# Patient Record
Sex: Female | Born: 1995
Health system: Southern US, Community
[De-identification: ages and names within clinical notes are randomized; demographics above are authoritative.]

## PROBLEM LIST (undated history)

## (undated) DIAGNOSIS — N946 Dysmenorrhea, unspecified: Secondary | ICD-10-CM

---
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
## 2015-05-18 ENCOUNTER — Ambulatory Visit: Payer: 59

## 2015-05-18 ENCOUNTER — Encounter: Payer: Self-pay | Admitting: Gynecology

## 2015-05-18 ENCOUNTER — Ambulatory Visit
Admission: EM | Admit: 2015-05-18 | Discharge: 2015-05-18 | Disposition: A | Discharge status: Home / Self Care | Payer: 59 | Attending: Family Medicine | Admitting: Family Medicine

## 2015-05-18 DIAGNOSIS — J40 Bronchitis, not specified as acute or chronic: Secondary | ICD-10-CM | POA: Diagnosis not present

## 2015-05-18 DIAGNOSIS — S2341XA Sprain of ribs, initial encounter: Secondary | ICD-10-CM

## 2015-05-18 DIAGNOSIS — J209 Acute bronchitis, unspecified: Secondary | ICD-10-CM | POA: Diagnosis not present

## 2015-05-18 DIAGNOSIS — S29011A Strain of muscle and tendon of front wall of thorax, initial encounter: Secondary | ICD-10-CM

## 2015-05-18 DIAGNOSIS — R05 Cough: Secondary | ICD-10-CM | POA: Diagnosis present

## 2015-05-18 HISTORY — DX: Dysmenorrhea, unspecified: N94.6

## 2015-05-18 MED ORDER — AZITHROMYCIN 250 MG PO TABS: ORAL_TABLET | ORAL | Status: DC

## 2015-05-18 MED ORDER — OXYCODONE-ACETAMINOPHEN 5-325 MG PO TABS: 1.0000 | ORAL_TABLET | Freq: Four times a day (QID) | ORAL | Status: DC | PRN

## 2015-05-18 NOTE — ED Provider Notes (Signed)
19 year old patient presents today with complaints of productive cough for the last 3 weeks. Patient states that she is started to have right anterior lateral rib pain with coughing, movement. She denies any night sweats or fever at home. She denies any other chest pain. She denies any significant past medical history asides dysmenorrhea. She denies any history of asthma or smoking. She does describe a pop that she felt on that side of her rib cage earlier today when coughing.  Review of systems negative except as mentioned above.  ROS: Negative except mentioned above.  GENERAL: NAD HEENT: no pharyngeal erythema, no exudate, no erythema of TMs, no cervical LAD RESP: CTA B, mild to moderate tenderness of anterior lateral ribcage on palpation, no stepoff appreciated  CARD: RRR NEURO: CN II-XII groslly intact   A/P: Bronchitis, intercostal strain X-ray negative for any acute process. Prescribed patient Zithromax and Percocet. Encourage patient to take deep breaths as much as possible. She is unable to take NSAIDs. She is to seek medical attention if symptoms do persist or worsen as discussed.  Jolene ProvostKirtida Gaia Gullikson, MD 05/18/15 [phone]

## 2015-05-18 NOTE — ED Notes (Signed)
Patient c/o x 3 weeks cold symptoms. Pt /co productive cough with greenish mucous. Pt. Also stated ride side muscle / rib pain dull / aching pain worse when couhing and taking a deep breath.

## 2016-03-03 DIAGNOSIS — J039 Acute tonsillitis, unspecified: Secondary | ICD-10-CM | POA: Diagnosis not present

## 2016-04-18 DIAGNOSIS — M84364A Stress fracture, left fibula, initial encounter for fracture: Secondary | ICD-10-CM | POA: Diagnosis not present

## 2016-05-19 ENCOUNTER — Ambulatory Visit
Admission: RE | Admit: 2016-05-19 | Discharge: 2016-05-19 | Disposition: A | Discharge status: Home / Self Care | Payer: 59 | Source: Ambulatory Visit | Attending: Pediatrics | Admitting: Pediatrics

## 2016-05-19 ENCOUNTER — Other Ambulatory Visit: Payer: Self-pay | Admitting: Pediatrics

## 2016-05-19 DIAGNOSIS — Z8781 Personal history of (healed) traumatic fracture: Secondary | ICD-10-CM

## 2016-05-19 DIAGNOSIS — S82402A Unspecified fracture of shaft of left fibula, initial encounter for closed fracture: Secondary | ICD-10-CM | POA: Diagnosis not present

## 2016-05-19 DIAGNOSIS — S82492A Other fracture of shaft of left fibula, initial encounter for closed fracture: Secondary | ICD-10-CM | POA: Diagnosis not present

## 2016-06-02 ENCOUNTER — Encounter: Payer: Self-pay | Admitting: Physical Therapy

## 2016-06-02 ENCOUNTER — Ambulatory Visit: Payer: 59 | Attending: Pediatrics | Admitting: Physical Therapy

## 2016-06-02 DIAGNOSIS — M25672 Stiffness of left ankle, not elsewhere classified: Secondary | ICD-10-CM | POA: Diagnosis not present

## 2016-06-02 DIAGNOSIS — M25572 Pain in left ankle and joints of left foot: Secondary | ICD-10-CM | POA: Insufficient documentation

## 2016-06-02 NOTE — Therapy (Signed)
[place] [facility] [address] [place], [place], [address] Phone: [phone]   Fax:  [phone]  Physical Therapy Evaluation  Patient Details  Name: Robin Hanna MRN: [id number] Date of Birth: 1996/09/04 Referring Provider: Baxter Hire Page  Encounter Date: 06/02/2016      PT End of Session - 06/02/16 1745    Visit Number 1   Number of Visits 1   Date for PT Re-Evaluation 06/03/16   PT Start Time 1344   PT Stop Time 1440   PT Time Calculation (min) 56 min   Activity Tolerance Patient tolerated treatment well;No increased pain   Behavior During Therapy [facility] for tasks assessed/performed      Past Medical History  Diagnosis Date  . Dysmenorrhea     History reviewed. No pertinent past surgical history.  There were no vitals filed for this visit.       Subjective Assessment - 06/02/16 1740    Subjective Pt. reports no ankle/ lower leg pain currently.  Pt. states she has been doing well since weaning off of boot.     Limitations Walking   Patient Stated Goals Increase L lower leg strength/ mobility.    Currently in Pain? No/denies            [facility] PT Assessment - 06/02/16 0001    Assessment   Medical Diagnosis L distal fibular fracture   Referring Provider Kristen Page   Onset Date/Surgical Date 04/21/16     See HEP (ankle/ LE stability ex.).  Recommended icing L ankle/lower leg if swelling noted.        PT Education - 06/02/16 1744    Education provided Yes   Education Details See HEP   Person(s) Educated Patient;Parent(s)   Methods Explanation;Demonstration;Handout   Comprehension Verbalized understanding;Returned demonstration             PT Long Term Goals - 06/02/16 1750    PT LONG TERM GOAL #1   Title Pt. I with HEP to increase L ankle stability (all planes) to improve pain-free mobility.    Time 1   Period Days   Status Achieved             Plan - 06/02/16 1745    Clinical Impression  Statement Pt. is a pleasant 20 y/o female s/p L distal fibular fracture.  Pt. is completely healed after last x-ray and has been out of boot over past week.  No c/o L ankle/lower leg pain at this time.  Pt. reports minimal stiffness and presents with no swelling in ankle (B ankle figure-8: 47.5 cm).  Ankle AROM WNL and strength grossly 5/5 MMT (all planes).  Good subtalar mobility.  Pt. ambulate with normalized gait pattern with no deficits noted.  Pt. instructed in LE stability ex. program to promote improved mobility.  Evaluation only with focus on independent HEP at this time.     Rehab Potential Excellent   PT Frequency 1x / week   PT Treatment/Interventions Gait training;Stair training;Functional mobility training;Therapeutic activities;Therapeutic exercise;Cryotherapy;Manual techniques   PT Next Visit Plan Evaluation only.  Pt. instructed to contact PT if any issues over next 1-2 weeks.    PT Home Exercise Plan See handouts.      Patient will benefit from skilled therapeutic intervention in order to improve the following deficits and impairments:  Decreased mobility, Decreased strength  Visit Diagnosis: Ankle joint stiffness, left  Pain in left ankle and joints of left foot  Problem List There are no active problems to display for this patient.  [name], PT, DPT # [phone]   06/02/2016, 5:52 PM  [place] [facility] [address] [place], [place], [address] Phone: [phone]   Fax:  [phone]  Name: Robin BordersRachel E Hanna MRN: [id number] Date of Birth: Feb 23, 1996

## 2016-08-06 DIAGNOSIS — H5213 Myopia, bilateral: Secondary | ICD-10-CM | POA: Diagnosis not present

## 2016-10-25 IMAGING — CR DG TIBIA/FIBULA 2V*L*
2 series · 2 of 2 positions shown · non-contrast
Comparison: None available

CLINICAL DATA: Hairline fracture of LEFT fibula 4.5 weeks ago

EXAM:
LEFT TIBIA AND FIBULA - 2 VIEW

[tibia ap]
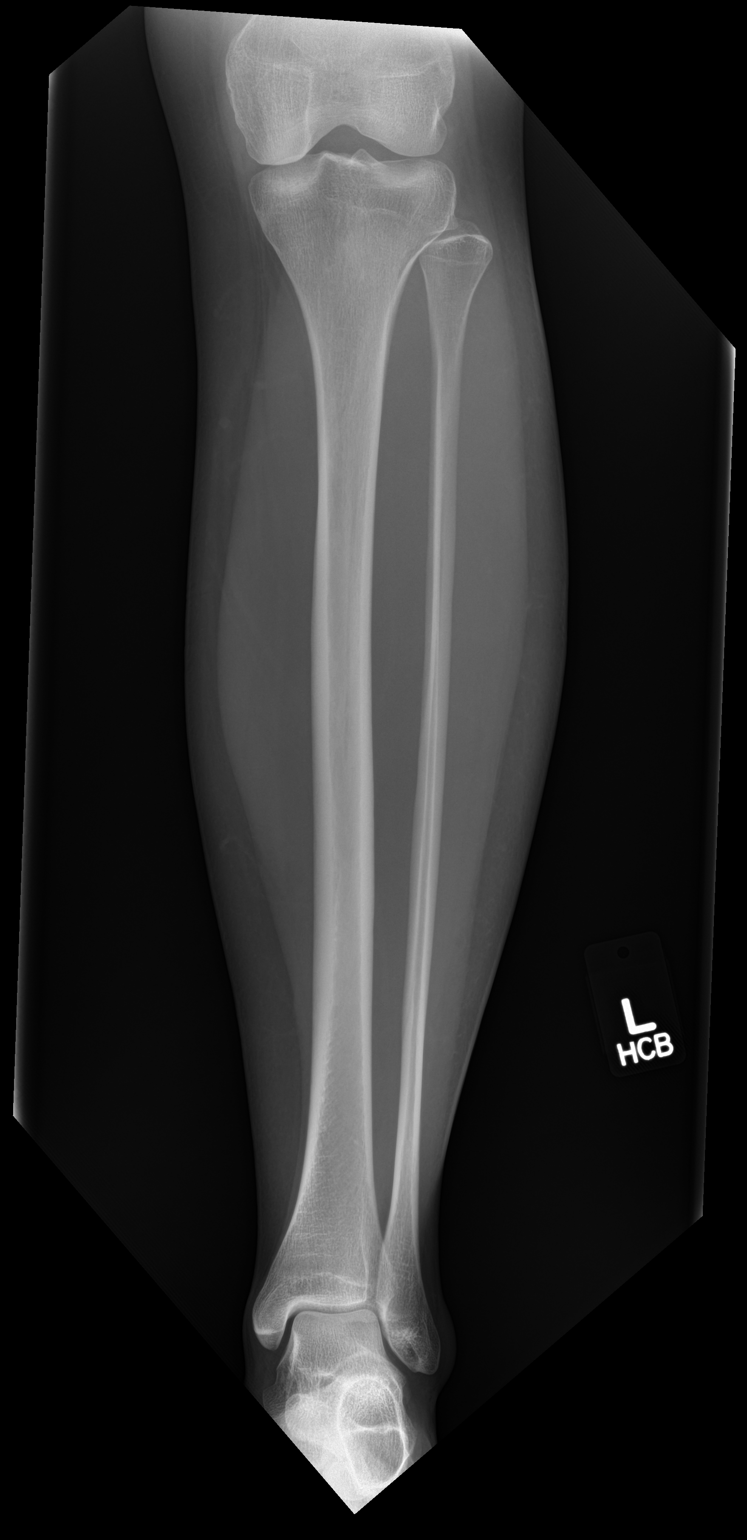

[tibia lat]
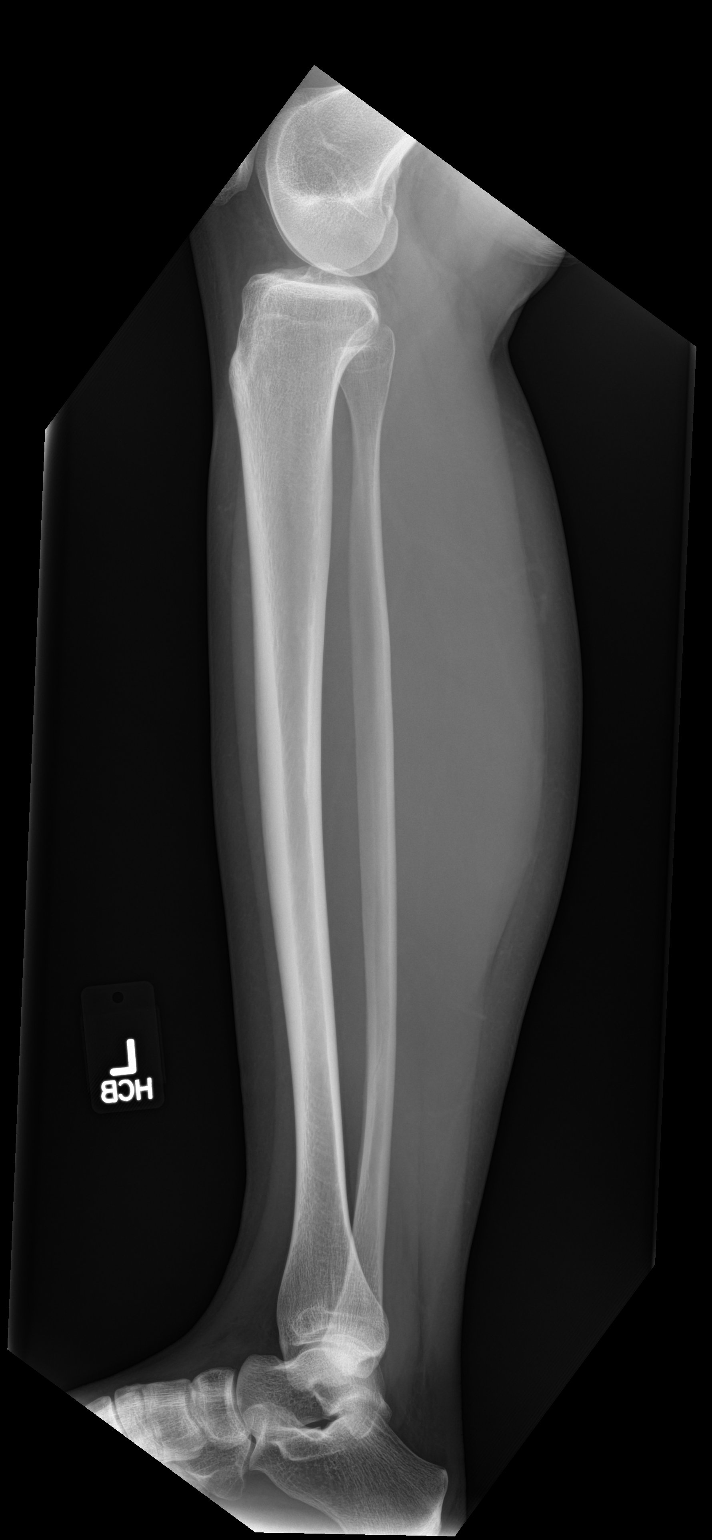

[2 of 2 positions shown; findings below may reference images not displayed]

FINDINGS: Knee and ankle joint alignments normal.

Osseous mineralization grossly normal.

No acute fracture, dislocation or bone destruction.

Specifically, no definite fibular fracture is identified.
IMPRESSION: Normal exam.

## 2016-12-18 DIAGNOSIS — L700 Acne vulgaris: Secondary | ICD-10-CM | POA: Diagnosis not present

## 2016-12-18 MED FILL — DOXYCYCLINE HYCLATE 100 MG: 100 | 30 days supply | Qty: 30 | Fill #0

## 2016-12-18 MED FILL — CLINDAMYCIN-BENZOYL PEROX 1: 1-5 | 30 days supply | Qty: 25 | Fill #0

## 2017-05-19 DIAGNOSIS — Z111 Encounter for screening for respiratory tuberculosis: Secondary | ICD-10-CM | POA: Diagnosis not present

## 2017-05-22 DIAGNOSIS — Z0000 Encounter for general adult medical examination without abnormal findings: Secondary | ICD-10-CM | POA: Diagnosis not present

## 2017-07-09 DIAGNOSIS — Z0182 Encounter for allergy testing: Secondary | ICD-10-CM | POA: Diagnosis not present

## 2017-07-09 DIAGNOSIS — L299 Pruritus, unspecified: Secondary | ICD-10-CM | POA: Diagnosis not present

## 2017-07-09 DIAGNOSIS — H1013 Acute atopic conjunctivitis, bilateral: Secondary | ICD-10-CM | POA: Diagnosis not present

## 2017-07-09 DIAGNOSIS — J3081 Allergic rhinitis due to animal (cat) (dog) hair and dander: Secondary | ICD-10-CM | POA: Diagnosis not present

## 2017-07-09 DIAGNOSIS — J301 Allergic rhinitis due to pollen: Secondary | ICD-10-CM | POA: Diagnosis not present

## 2017-08-17 DIAGNOSIS — H5213 Myopia, bilateral: Secondary | ICD-10-CM | POA: Diagnosis not present

## 2017-08-17 DIAGNOSIS — H47393 Other disorders of optic disc, bilateral: Secondary | ICD-10-CM | POA: Diagnosis not present

## 2017-08-27 MED FILL — CLINDAMYCIN-BENZOYL PEROX 1: 1-5 | 30 days supply | Qty: 25 | Fill #1

## 2018-12-02 DIAGNOSIS — H5213 Myopia, bilateral: Secondary | ICD-10-CM | POA: Diagnosis not present

## 2019-09-30 ENCOUNTER — Emergency Department
Admission: EM | Admit: 2019-09-30 | Discharge: 2019-09-30 | Disposition: A | Discharge status: Home / Self Care | Payer: 59 | Attending: Emergency Medicine | Admitting: Emergency Medicine

## 2019-09-30 ENCOUNTER — Emergency Department: Payer: 59

## 2019-09-30 ENCOUNTER — Encounter: Payer: Self-pay | Admitting: Emergency Medicine

## 2019-09-30 ENCOUNTER — Other Ambulatory Visit: Payer: Self-pay

## 2019-09-30 DIAGNOSIS — R1084 Generalized abdominal pain: Secondary | ICD-10-CM | POA: Diagnosis not present

## 2019-09-30 DIAGNOSIS — I10 Essential (primary) hypertension: Secondary | ICD-10-CM | POA: Diagnosis not present

## 2019-09-30 DIAGNOSIS — R52 Pain, unspecified: Secondary | ICD-10-CM | POA: Diagnosis not present

## 2019-09-30 DIAGNOSIS — R102 Pelvic and perineal pain: Secondary | ICD-10-CM | POA: Diagnosis not present

## 2019-09-30 DIAGNOSIS — R195 Other fecal abnormalities: Secondary | ICD-10-CM | POA: Insufficient documentation

## 2019-09-30 DIAGNOSIS — R0902 Hypoxemia: Secondary | ICD-10-CM | POA: Diagnosis not present

## 2019-09-30 DIAGNOSIS — R55 Syncope and collapse: Secondary | ICD-10-CM | POA: Diagnosis not present

## 2019-09-30 DIAGNOSIS — R1030 Lower abdominal pain, unspecified: Secondary | ICD-10-CM | POA: Diagnosis not present

## 2019-09-30 LAB — BASIC METABOLIC PANEL
Anion gap: 8 (ref 5–15)
BUN: 11 mg/dL (ref 6–20)
CO2: 24 mmol/L (ref 22–32)
Calcium: 9.3 mg/dL (ref 8.9–10.3)
Chloride: 107 mmol/L (ref 98–111)
Creatinine, Ser: 0.62 mg/dL (ref 0.44–1.00)
GFR calc Af Amer: >60 mL/min (ref >60)
GFR calc non Af Amer: >60 mL/min (ref >60)
Glucose, Bld: 95 mg/dL (ref 70–99)
Potassium: 3.6 mmol/L (ref 3.5–5.1)
Sodium: 139 mmol/L (ref 135–145)

## 2019-09-30 LAB — CBC
HCT: 38.7 % (ref 36.0–46.0)
Hemoglobin: 13 g/dL (ref 12.0–15.0)
MCH: 30.3 pg (ref 26.0–34.0)
MCHC: 33.6 g/dL (ref 30.0–36.0)
MCV: 90.2 fL (ref 80.0–100.0)
Platelets: 241 10*3/uL (ref 150–400)
RBC: 4.29 MIL/uL (ref 3.87–5.11)
RDW: 12.7 % (ref 11.5–15.5)
WBC: 9.9 10*3/uL (ref 4.0–10.5)
nRBC: 0 % (ref 0.0–0.2)

## 2019-09-30 LAB — URINALYSIS, COMPLETE (UACMP) WITH MICROSCOPIC
Bacteria, UA: NONE SEEN
Bilirubin Urine: NEGATIVE
Glucose, UA: NEGATIVE mg/dL
Ketones, ur: 5 mg/dL — AB
Leukocytes,Ua: NEGATIVE
Nitrite: NEGATIVE
Protein, ur: NEGATIVE mg/dL
Specific Gravity, Urine: 1.017 (ref 1.005–1.030)
pH: 6 (ref 5.0–8.0)

## 2019-09-30 LAB — PREGNANCY, URINE: Preg Test, Ur: NEGATIVE

## 2019-09-30 NOTE — ED Notes (Addendum)
Called down to lab to run an add on urine POC.

## 2019-09-30 NOTE — ED Provider Notes (Signed)
[facility] Emergency Department Provider Note  ____________________________________________  Time seen: Approximately 4:09 PM  I have reviewed the triage vital signs and the nursing notes.   HISTORY  Chief Complaint Dysmenorrhea and Near Syncope    HPI Robin Hanna is a 23 y.o. female who presents the emergency department concern for sharp lower pelvic pain.  Patient reports that today she started her menstrual cycle, typically has cramping for several days prior which is been normal.  Patient states that she went to the bathroom, and as she was leaving the bathroom she developed excruciatingly sharp lower pelvic pain.  Patient reports that the pain was so severe that she started feeling nauseated, did have one episode of emesis and then felt near syncopal.  According to the patient, her mother reports that she was not acting quite her normal self during this.  Patient reports that the pain relieved and is currently a 1 out of 10.  During its occurrence, patient describes the pain as 10 out of 10.  Patient denies any recent fevers or chills, nasal congestion, chest pain, palpitations, shortness of breath, any previous abdominal pain, nausea or vomiting, diarrhea or constipation.  Patient reports that she did have more bowel movements than normal over the last 2 days.  She states that she had 3 bowel movements yesterday, 1 today.  Patient denies any rectal bleeding.  Patient is on her menses but no vaginal discharge.  No concern for pregnancy or STD.  Patient reports that every 2 to 3 years she experiences a similar feeling during her period however it is never been this severe or caused near syncope.         Past Medical History:  Diagnosis Date  . Dysmenorrhea     There are no active problems to display for this patient.   History reviewed. No pertinent surgical history.  Prior to Admission medications   Medication Sig Start Date End Date Taking?  Authorizing Provider  azithromycin (ZITHROMAX Z-PAK) 250 MG tablet Use as directed on box. Patient not taking: Reported on 06/02/2016 05/18/15   Jolene Provost, MD  Norgestimate-Eth Estradiol (SPRINTEC 28 PO) Take by mouth.    [provider]  oxyCODONE-acetaminophen (PERCOCET/ROXICET) 5-325 MG per tablet Take 1 tablet by mouth every 6 (six) hours as needed for moderate pain or severe pain. Patient not taking: Reported on 06/02/2016 05/18/15   Jolene Provost, MD    Allergies Ibuprofen  No family history on file.  Social History Social History   Tobacco Use  . Smoking status: Never Smoker  . Smokeless tobacco: Never Used  Substance Use Topics  . Alcohol use: No  . Drug use: Not Currently     Review of Systems  Constitutional: No fever/chills Eyes: No visual changes. No discharge ENT: No upper respiratory complaints. Cardiovascular: no chest pain. Respiratory: no cough. No SOB. Gastrointestinal: No abdominal pain.  No nausea, no vomiting.  No diarrhea.  No constipation. Genitourinary: Pelvic pain.  Negative for dysuria. No hematuria.  No abnormal vaginal bleeding or discharge other than menstrual bleeding. Musculoskeletal: Negative for musculoskeletal pain. Skin: Negative for rash, abrasions, lacerations, ecchymosis. Neurological: Negative for headaches, focal weakness or numbness. 10-point ROS otherwise negative.  ____________________________________________   PHYSICAL EXAM:  VITAL SIGNS: ED Triage Vitals  Enc Vitals Group     BP 09/30/19 1312 117/62     Pulse Rate 09/30/19 1312 (!) 115     Resp 09/30/19 1312 16     Temp 09/30/19 1312  98.4 F (36.9 C)     Temp Source 09/30/19 1312 Oral     SpO2 09/30/19 1312 100 %     Weight 09/30/19 1317 115 lb (52.2 kg)     Height 09/30/19 1317 5\' 6"  (1.676 m)     Head Circumference --      Peak Flow --      Pain Score 09/30/19 1317 1     Pain Loc --      Pain Edu? --      Excl. in GC? --      Constitutional: Alert  and oriented. Well appearing and in no acute distress. Eyes: Conjunctivae are normal. PERRL. EOMI. Head: Atraumatic. ENT:      Ears:       Nose: No congestion/rhinnorhea.      Mouth/Throat: Mucous membranes are moist.  Neck: No stridor.    Cardiovascular: Normal rate, regular rhythm. Normal S1 and S2.  Good peripheral circulation. Respiratory: Normal respiratory effort without tachypnea or retractions. Lungs CTAB. Good air entry to the bases with no decreased or absent breath sounds. Gastrointestinal: No visible abnormal external abdominal wall findings.  Bowel sounds 4 quadrants. Soft and nontender to palpation all 4 quadrants.  No tenderness to palpation of the suprapubic region.. No guarding or rigidity. No palpable masses. No distention. No CVA tenderness. Genitourinary: Pelvic exam not performed at this time. Musculoskeletal: Full range of motion to all extremities. No gross deformities appreciated. Neurologic:  Normal speech and language. No gross focal neurologic deficits are appreciated.  Skin:  Skin is warm, dry and intact. No rash noted. Psychiatric: Mood and affect are normal. Speech and behavior are normal. Patient exhibits appropriate insight and judgement.   ____________________________________________   LABS (all labs ordered are listed, but only abnormal results are displayed)  Labs Reviewed  URINALYSIS, COMPLETE (UACMP) WITH MICROSCOPIC - Abnormal; Notable for the following components:      Result Value   Color, Urine YELLOW (*)    APPearance HAZY (*)    Hgb urine dipstick SMALL (*)    Ketones, ur 5 (*)    All other components within normal limits  BASIC METABOLIC PANEL  CBC  PREGNANCY, URINE  CBG MONITORING, ED   ____________________________________________  EKG   ____________________________________________  RADIOLOGY I personally viewed and evaluated these images as part of my medical decision making, as well as reviewing the written report by the  radiologist.  Dg Abdomen 1 View  Result Date: 09/30/2019 CLINICAL DATA:  Lower abdominal pain EXAM: ABDOMEN - 1 VIEW COMPARISON:  None. FINDINGS: No dilated small bowel loops. Mild colorectal stool volume. No evidence of pneumatosis or pneumoperitoneum. No radiopaque nephrolithiasis. Clear visualized lung bases. IMPRESSION: Nonobstructive bowel gas pattern. Electronically Signed   By: 11/30/2019 M.D.   On: 09/30/2019 18:21   11/30/2019 Pelvis (transabdominal Only)  Result Date: 09/30/2019 CLINICAL DATA:  Severe pelvic pain EXAM: TRANSABDOMINAL ULTRASOUND OF PELVIS DOPPLER ULTRASOUND OF OVARIES TECHNIQUE: Transabdominal ultrasound examination of the pelvis was performed. Transabdominal technique was performed for global imaging of the pelvis including uterus, ovaries, adnexal regions, and pelvic cul-de-sac. Color and duplex Doppler ultrasound was utilized to evaluate blood flow to the ovaries. COMPARISON:  None. FINDINGS: Uterus Measurements: 7.1 x 3.3 x 4.4 cm = volume: 55 mL. No fibroids or other mass visualized. Endometrium Thickness: 9 mm.  No focal abnormality visualized. Right ovary Measurements: 3.7 x 1.8 x 2.0 cm = volume: 7 mL. Subcentimeter follicles. Normal appearance/no adnexal mass. Left ovary Measurements: 3.0  x 2.4 x 2.4 cm = volume: 9 mL. Subcentimeter follicles. Normal appearance/no adnexal mass. Pulsed Doppler evaluation of both ovaries demonstrates normal low-resistance arterial and venous waveforms. Other findings No abnormal free fluid. IMPRESSION: No ultrasound abnormality of the pelvis to explain pain. Electronically Signed   By: Eddie Candle M.D.   On: 09/30/2019 18:32   US Pelvic Doppler Limited  Result Date: 09/30/2019 CLINICAL DATA:  Severe pelvic pain EXAM: TRANSABDOMINAL ULTRASOUND OF PELVIS DOPPLER ULTRASOUND OF OVARIES TECHNIQUE: Transabdominal ultrasound examination of the pelvis was performed. Transabdominal technique was performed for global imaging of the pelvis including  uterus, ovaries, adnexal regions, and pelvic cul-de-sac. Color and duplex Doppler ultrasound was utilized to evaluate blood flow to the ovaries. COMPARISON:  None. FINDINGS: Uterus Measurements: 7.1 x 3.3 x 4.4 cm = volume: 55 mL. No fibroids or other mass visualized. Endometrium Thickness: 9 mm.  No focal abnormality visualized. Right ovary Measurements: 3.7 x 1.8 x 2.0 cm = volume: 7 mL. Subcentimeter follicles. Normal appearance/no adnexal mass. Left ovary Measurements: 3.0 x 2.4 x 2.4 cm = volume: 9 mL. Subcentimeter follicles. Normal appearance/no adnexal mass. Pulsed Doppler evaluation of both ovaries demonstrates normal low-resistance arterial and venous waveforms. Other findings No abnormal free fluid. IMPRESSION: No ultrasound abnormality of the pelvis to explain pain. Electronically Signed   By: Eddie Candle M.D.   On: 09/30/2019 18:32    ____________________________________________    PROCEDURES  Procedure(s) performed:    Procedures    Medications - No data to display   ____________________________________________   INITIAL IMPRESSION / ASSESSMENT AND PLAN / ED COURSE  Pertinent labs & imaging results that were available during my care of the patient were reviewed by me and considered in my medical decision making (see chart for details).  Review of the [place] CSRS was performed in accordance of the [place] prior to dispensing any controlled drugs.           Patient's diagnosis is consistent with pelvic pain, near-syncope, increased stool volume.  Patient presented to the emergency department complaining of sharp pelvic pain that lasted for approximately 20 minutes and then resolved.  During this time, patient began to feel lightheaded, had a near syncopal episode with nausea and emesis.  Patient reports that pain is a 1 at this time.  She did start her menses today.  She has had intermittent pain with her periods.  Patient was on birth control at one point for metorrhagia.  At  this time, labs, urinalysis, ultrasound are reassuring.  Patient did have moderate stool in the distal colon and rectal region.  I suspect that symptoms are likely attributed to both pelvic pain from cramping as well as mild constipation.  I encouraged the patient to drink plenty of fluids, eat fiber, use MiraLAX as needed.  Tylenol Motrin with additional fluids and heat as needed for pelvic pain.  At this time no indication for further work-up.  Patient will follow-up with OB/GYN.. Patient is given ED precautions to return to the ED for any worsening or new symptoms.     ____________________________________________  FINAL CLINICAL IMPRESSION(S) / ED DIAGNOSES  Final diagnoses:  Pelvic pain  Near syncope  Increased stool volume      NEW MEDICATIONS STARTED DURING THIS VISIT:  ED Discharge Orders    None          This chart was dictated using voice recognition software/Dragon. Despite best efforts to proofread, errors can occur which can change the meaning. Any change  was purely unintentional.    Racheal PatchesCuthriell, Jonathan D, PA-C 09/30/19 1909    Emily FilbertWilliams, Jonathan E, MD 09/30/19 2008

## 2019-09-30 NOTE — ED Notes (Signed)
Called lab and spoke with [facility] and he will add on urine preg to urine in lab

## 2019-09-30 NOTE — ED Notes (Signed)
First Nurse Note: Pt to ED via EMS for abdominal pain and near syncopal episode. Pt initially c/o 10/10 abdominal pain, now states that pain is 1/10.

## 2019-09-30 NOTE — ED Triage Notes (Signed)
Pt to ED via [facility] from home for menstrual cramps that patient rated 10/10 and near syncopal episode. Pt mother told EMS pt got very pale and was not responding appropriately. By the time pt got to the ED she stated that her pain was better. Pt is currently rating pain 1/10. Pt is tachycardic in triage, otherwise NAD.

## 2019-10-24 DIAGNOSIS — Z0000 Encounter for general adult medical examination without abnormal findings: Secondary | ICD-10-CM | POA: Diagnosis not present

## 2019-10-24 DIAGNOSIS — N946 Dysmenorrhea, unspecified: Secondary | ICD-10-CM | POA: Diagnosis not present

## 2019-10-24 DIAGNOSIS — Z13220 Encounter for screening for lipoid disorders: Secondary | ICD-10-CM | POA: Diagnosis not present

## 2019-10-25 MED FILL — LIDOCAINE HCL 2% JELLY: 2 | 7 days supply | Qty: 30 | Fill #0

## 2019-10-25 MED FILL — FEMYNOR 0.25-35 MG-MCG TABS: 0.25-35 | 84 days supply | Qty: 84 | Fill #0

## 2020-01-26 MED FILL — FEMYNOR 0.25-35 MG-MCG TABS: 0.25-35 | 84 days supply | Qty: 84 | Fill #1

## 2020-02-20 DIAGNOSIS — R002 Palpitations: Secondary | ICD-10-CM | POA: Insufficient documentation

## 2020-02-24 DIAGNOSIS — G8929 Other chronic pain: Secondary | ICD-10-CM | POA: Insufficient documentation

## 2020-02-24 DIAGNOSIS — R002 Palpitations: Secondary | ICD-10-CM | POA: Diagnosis not present

## 2020-02-24 DIAGNOSIS — R9431 Abnormal electrocardiogram [ECG] [EKG]: Secondary | ICD-10-CM | POA: Insufficient documentation

## 2020-03-23 DIAGNOSIS — R9431 Abnormal electrocardiogram [ECG] [EKG]: Secondary | ICD-10-CM | POA: Diagnosis not present

## 2020-03-23 DIAGNOSIS — R002 Palpitations: Secondary | ICD-10-CM | POA: Diagnosis not present

## 2020-04-25 MED FILL — FEMYNOR 0.25-35 MG-MCG TABS: 0.25-35 | 84 days supply | Qty: 84 | Fill #2

## 2020-05-11 DIAGNOSIS — R140 Abdominal distension (gaseous): Secondary | ICD-10-CM | POA: Diagnosis not present

## 2020-05-11 DIAGNOSIS — R1084 Generalized abdominal pain: Secondary | ICD-10-CM | POA: Diagnosis not present

## 2020-05-18 DIAGNOSIS — K5900 Constipation, unspecified: Secondary | ICD-10-CM | POA: Diagnosis not present

## 2020-05-18 DIAGNOSIS — R140 Abdominal distension (gaseous): Secondary | ICD-10-CM | POA: Diagnosis not present

## 2020-05-18 DIAGNOSIS — R1084 Generalized abdominal pain: Secondary | ICD-10-CM | POA: Diagnosis not present

## 2020-05-18 DIAGNOSIS — R195 Other fecal abnormalities: Secondary | ICD-10-CM | POA: Diagnosis not present

## 2020-06-11 DIAGNOSIS — R140 Abdominal distension (gaseous): Secondary | ICD-10-CM | POA: Diagnosis not present

## 2020-07-19 MED FILL — FEMYNOR 0.25-35 MG-MCG TABS: 0.25-35 | 84 days supply | Qty: 84 | Fill #3

## 2020-11-13 DIAGNOSIS — N942 Vaginismus: Secondary | ICD-10-CM | POA: Diagnosis not present

## 2020-11-13 DIAGNOSIS — Z0000 Encounter for general adult medical examination without abnormal findings: Secondary | ICD-10-CM | POA: Diagnosis not present

## 2021-01-16 DIAGNOSIS — M9903 Segmental and somatic dysfunction of lumbar region: Secondary | ICD-10-CM | POA: Diagnosis not present

## 2021-01-16 DIAGNOSIS — M9901 Segmental and somatic dysfunction of cervical region: Secondary | ICD-10-CM | POA: Diagnosis not present

## 2021-01-16 DIAGNOSIS — M9902 Segmental and somatic dysfunction of thoracic region: Secondary | ICD-10-CM | POA: Diagnosis not present

## 2021-02-04 DIAGNOSIS — M9902 Segmental and somatic dysfunction of thoracic region: Secondary | ICD-10-CM | POA: Diagnosis not present

## 2021-02-04 DIAGNOSIS — M9901 Segmental and somatic dysfunction of cervical region: Secondary | ICD-10-CM | POA: Diagnosis not present

## 2021-02-04 DIAGNOSIS — M9903 Segmental and somatic dysfunction of lumbar region: Secondary | ICD-10-CM | POA: Diagnosis not present

## 2021-08-21 ENCOUNTER — Other Ambulatory Visit: Payer: Self-pay

## 2021-08-21 ENCOUNTER — Encounter: Payer: Self-pay | Admitting: Emergency Medicine

## 2021-08-21 ENCOUNTER — Ambulatory Visit
Admission: EM | Admit: 2021-08-21 | Discharge: 2021-08-21 | Disposition: A | Discharge status: Home / Self Care | Payer: BC Managed Care – PPO | Attending: Family Medicine | Admitting: Family Medicine

## 2021-08-21 DIAGNOSIS — J028 Acute pharyngitis due to other specified organisms: Secondary | ICD-10-CM | POA: Insufficient documentation

## 2021-08-21 DIAGNOSIS — Z20822 Contact with and (suspected) exposure to covid-19: Secondary | ICD-10-CM | POA: Insufficient documentation

## 2021-08-21 DIAGNOSIS — J029 Acute pharyngitis, unspecified: Secondary | ICD-10-CM

## 2021-08-21 DIAGNOSIS — Z886 Allergy status to analgesic agent status: Secondary | ICD-10-CM | POA: Insufficient documentation

## 2021-08-21 DIAGNOSIS — B9789 Other viral agents as the cause of diseases classified elsewhere: Secondary | ICD-10-CM | POA: Insufficient documentation

## 2021-08-21 LAB — POCT RAPID STREP A: Streptococcus, Group A Screen (Direct): NEGATIVE

## 2021-08-21 NOTE — ED Provider Notes (Signed)
[facility]    CSN: [id number] Arrival date & time: 08/21/21  1738      History   Chief Complaint Chief Complaint  Patient presents with   Sore Throat   Fever    HPI  25 year old female presents with the above complaints.  Sore throat started this morning.  Patient reports a low-grade fever of 99.  She has no true documented fever.  She states that she has pain with swallowing.  She has taken a home COVID test and it was negative.  She has no other respiratory symptoms.  Her pain is 4/10 in severity.  No other complaints.  Past Medical History:  Diagnosis Date   Dysmenorrhea    Home Medications    Prior to Admission medications   Medication Sig Start Date End Date Taking? Authorizing Provider  Norgestimate-Eth Estradiol (SPRINTEC 28 PO) Take by mouth.    [provider]   Social History Social History   Tobacco Use   Smoking status: Never   Smokeless tobacco: Never  Substance Use Topics   Alcohol use: No   Drug use: Not Currently     Allergies   Ibuprofen   Review of Systems Review of Systems  HENT:  Positive for sore throat.   Respiratory: Negative.     Physical Exam Triage Vital Signs ED Triage Vitals  Enc Vitals Group     BP 08/21/21 1801 (!) 114/55     Pulse Rate 08/21/21 1801 80     Resp 08/21/21 1801 16     Temp 08/21/21 1801 99.2 F (37.3 C)     Temp Source 08/21/21 1801 Oral     SpO2 08/21/21 1801 100 %     Weight 08/21/21 1759 115 lb (52.2 kg)     Height --      Head Circumference --      Peak Flow --      Pain Score 08/21/21 1758 4     Pain Loc --      Pain Edu? --      Excl. in GC? --    Updated Vital Signs BP (!) 114/55 (BP Location: Left Arm)   Pulse 80   Temp 99.2 F (37.3 C) (Oral)   Resp 16   Wt 52.2 kg   LMP 08/19/2021   SpO2 100%   BMI 18.56 kg/m   Visual Acuity Right Eye Distance:   Left Eye Distance:   Bilateral Distance:    Right Eye Near:   Left Eye Near:    Bilateral Near:      Physical Exam Vitals and nursing note reviewed.  Constitutional:      General: She is not in acute distress.    Appearance: Normal appearance. She is not ill-appearing.  HENT:     Head: Normocephalic and atraumatic.     Right Ear: Tympanic membrane normal.     Left Ear: Tympanic membrane normal.     Mouth/Throat:     Pharynx: Posterior oropharyngeal erythema present. No oropharyngeal exudate.  Cardiovascular:     Rate and Rhythm: Normal rate and regular rhythm.  Pulmonary:     Effort: Pulmonary effort is normal.     Breath sounds: Normal breath sounds. No wheezing, rhonchi or rales.  Neurological:     Mental Status: She is alert.   UC Treatments / Results  Labs (all labs ordered are listed, but only abnormal results are displayed) Labs Reviewed  SARS CORONAVIRUS 2 (TAT 6-24 HRS)  CULTURE, GROUP A  STREP [facility])  POCT RAPID STREP A, ED / UC  POCT RAPID STREP A    EKG   Radiology No results found.  Procedures Procedures (including critical care time)  Medications Ordered in UC Medications - No data to display  Initial Impression / Assessment and Plan / UC Course  I have reviewed the triage vital signs and the nursing notes.  Pertinent labs & imaging results that were available during my care of the patient were reviewed by me and considered in my medical decision making (see chart for details).    25 year old female presents with sore throat.  Mild erythema on exam.  Strep negative.  Awaiting Test results.  Advised supportive care.  Tylenol as needed.  Warm salt water gargles.  Final Clinical Impressions(s) / UC Diagnoses   Final diagnoses:  Viral pharyngitis     Discharge Instructions      Strep negative.  Warm salt water gargles.  Take care  Dr. Adriana Simas      ED Prescriptions   None    PDMP not reviewed this encounter.   Tommie Sams, [place] 08/21/21 1839

## 2021-08-21 NOTE — ED Triage Notes (Signed)
Pt presents today with c/o of sore throat and low grade fever that began this morning. She took home covid test today; neg.

## 2021-08-21 NOTE — Discharge Instructions (Addendum)
Strep negative.  Warm salt water gargles.  Take care  Dr. Adriana Simas

## 2021-08-22 LAB — SARS CORONAVIRUS 2 (TAT 6-24 HRS): SARS Coronavirus 2: NEGATIVE

## 2021-08-24 LAB — CULTURE, GROUP A STREP (THRC)

## 2021-08-28 ENCOUNTER — Other Ambulatory Visit: Payer: Self-pay

## 2021-08-28 ENCOUNTER — Ambulatory Visit
Admission: EM | Admit: 2021-08-28 | Discharge: 2021-08-28 | Disposition: A | Discharge status: Home / Self Care | Payer: BC Managed Care – PPO | Attending: Sports Medicine | Admitting: Sports Medicine

## 2021-08-28 DIAGNOSIS — U071 COVID-19: Secondary | ICD-10-CM | POA: Insufficient documentation

## 2021-08-28 DIAGNOSIS — J028 Acute pharyngitis due to other specified organisms: Secondary | ICD-10-CM | POA: Insufficient documentation

## 2021-08-28 DIAGNOSIS — R0981 Nasal congestion: Secondary | ICD-10-CM | POA: Diagnosis present

## 2021-08-28 DIAGNOSIS — J069 Acute upper respiratory infection, unspecified: Secondary | ICD-10-CM | POA: Diagnosis present

## 2021-08-28 DIAGNOSIS — R059 Cough, unspecified: Secondary | ICD-10-CM | POA: Diagnosis present

## 2021-08-28 DIAGNOSIS — B9789 Other viral agents as the cause of diseases classified elsewhere: Secondary | ICD-10-CM | POA: Diagnosis present

## 2021-08-28 MED ORDER — FLUTICASONE PROPIONATE 50 MCG/ACT NA SUSP: 2.0000 | Freq: Every day | NASAL | 0 refills | Status: DC

## 2021-08-28 MED ORDER — PROMETHAZINE-DM 6.25-15 MG/5ML PO SYRP: 5.0000 mL | ORAL_SOLUTION | Freq: Four times a day (QID) | ORAL | 0 refills | Status: DC | PRN

## 2021-08-28 NOTE — ED Provider Notes (Signed)
[facility]    CSN: [id number] Arrival date & time: 08/28/21  0856      History   Chief Complaint Chief Complaint  Patient presents with   Cough    HPI Robin Hanna is a 25 y.o. female.   25 year old female presents for evaluation of persistent URI symptoms.  She was seen here 6 days ago and diagnosed with viral pharyngitis.  Sore throat is improving.  Strep test was negative.  She is now complaining of increased cough with mild mucus production.  She is also complaining of nasal congestion, and stuffy nose.  Has been taking Tylenol, Zicam, and Benadryl.  She reports that she has a low-grade fever to 99.  She is currently afebrile.  Vital signs are stable.  Normally goes to [facility] in [place].  She works in [employer] in [employer].  She says she does not need a work note.  No chest pain or shortness of breath.  She has been exposed to COVID.  She did do a home COVID test 4 days ago that was positive.  She states that her symptoms are the same as her parents.  No abdominal or urinary symptoms.  She is requesting a COVID test.  No red flag signs or symptoms elicited on history.   Past Medical History:  Diagnosis Date   Dysmenorrhea     There are no problems to display for this patient.   History reviewed. No pertinent surgical history.  OB History   No obstetric history on file.      Home Medications    Prior to Admission medications   Medication Sig Start Date End Date Taking? Authorizing Provider  fluticasone (FLONASE) 50 MCG/ACT nasal spray Place 2 sprays into both nostrils daily. 08/28/21  Yes Delton See, MD  promethazine-dextromethorphan (PROMETHAZINE-DM) 6.25-15 MG/5ML syrup Take 5 mLs by mouth 4 (four) times daily as needed for cough. 08/28/21  Yes Delton See, MD  Norgestimate-Eth Estradiol (SPRINTEC 28 PO) Take by mouth.    [provider]    Family History History reviewed. No pertinent family history.  Social  History Social History   Tobacco Use   Smoking status: Never   Smokeless tobacco: Never  Substance Use Topics   Alcohol use: No   Drug use: Not Currently     Allergies   Ibuprofen and Nsaids   Review of Systems Review of Systems  Constitutional:  Positive for fever. Negative for activity change, appetite change, chills, diaphoresis and fatigue.  HENT:  Positive for congestion. Negative for ear pain, postnasal drip, rhinorrhea, sinus pressure, sinus pain, sneezing and sore throat.   Eyes:  Negative for pain.  Respiratory:  Positive for cough. Negative for chest tightness, shortness of breath and wheezing.   Cardiovascular:  Negative for chest pain and palpitations.  Gastrointestinal:  Negative for abdominal pain, diarrhea, nausea and vomiting.  Genitourinary:  Negative for dysuria.  Musculoskeletal:  Negative for back pain, myalgias and neck pain.  Skin:  Negative for color change, pallor, rash and wound.  Neurological:  Negative for dizziness, light-headedness and headaches.  All other systems reviewed and are negative.   Physical Exam Triage Vital Signs ED Triage Vitals  Enc Vitals Group     BP 08/28/21 0908 123/70     Pulse Rate 08/28/21 0908 81     Resp 08/28/21 0908 18     Temp 08/28/21 0908 98.7 F (37.1 C)     Temp Source 08/28/21 0908 Oral  SpO2 08/28/21 0908 100 %     Weight 08/28/21 0906 115 lb (52.2 kg)     Height 08/28/21 0906 5\' 6"  (1.676 m)     Head Circumference --      Peak Flow --      Pain Score 08/28/21 0906 0     Pain Loc --      Pain Edu? --      Excl. in GC? --    No data found.  Updated Vital Signs BP 123/70 (BP Location: Left Arm)   Pulse 81   Temp 98.7 F (37.1 C) (Oral)   Resp 18   Ht 5\' 6"  (1.676 m)   Wt 52.2 kg   LMP 08/19/2021   SpO2 100%   BMI 18.56 kg/m   Visual Acuity Right Eye Distance:   Left Eye Distance:   Bilateral Distance:    Right Eye Near:   Left Eye Near:    Bilateral Near:     Physical Exam Vitals  and nursing note reviewed.  Constitutional:      General: She is not in acute distress.    Appearance: Normal appearance. She is not ill-appearing, toxic-appearing or diaphoretic.  HENT:     Head: Normocephalic and atraumatic.     Right Ear: Tympanic membrane normal.     Left Ear: Tympanic membrane normal.     Nose: Congestion present. No rhinorrhea.     Mouth/Throat:     Mouth: Mucous membranes are moist.     Pharynx: Posterior oropharyngeal erythema present. No oropharyngeal exudate.  Eyes:     General:        Right eye: No discharge.        Left eye: No discharge.     Extraocular Movements: Extraocular movements intact.     Conjunctiva/sclera: Conjunctivae normal.     Pupils: Pupils are equal, round, and reactive to light.  Cardiovascular:     Rate and Rhythm: Normal rate and regular rhythm.     Pulses: Normal pulses.     Heart sounds: Normal heart sounds. No murmur heard.   No friction rub. No gallop.  Pulmonary:     Effort: Pulmonary effort is normal.     Breath sounds: Normal breath sounds. No stridor. No wheezing, rhonchi or rales.  Musculoskeletal:     Cervical back: Normal range of motion and neck supple.  Skin:    General: Skin is warm and dry.     Capillary Refill: Capillary refill takes less than 2 seconds.  Neurological:     General: No focal deficit present.     Mental Status: She is alert and oriented to person, place, and time.     UC Treatments / Results  Labs (all labs ordered are listed, but only abnormal results are displayed) Labs Reviewed  SARS CORONAVIRUS 2 (TAT 6-24 HRS)    EKG   Radiology No results found.  Procedures Procedures (including critical care time)  Medications Ordered in UC Medications - No data to display  Initial Impression / Assessment and Plan / UC Course  I have reviewed the triage vital signs and the nursing notes.  Pertinent labs & imaging results that were available during my care of the patient were reviewed by  me and considered in my medical decision making (see chart for details).  Clinical impression: 1.  Viral upper respiratory tract infection 2.  Cough 3.  Nasal congestion 4.  Viral sore throat  Treatment plan: 1.  The findings and treatment  plan were discussed in detail with the patient.  Patient was in agreement. 2.  We will add in medication for her cough and nasal congestion.  Sent them to the pharmacy. 3.  Educational handouts provided. 4.  We will go ahead and retest her for COVID.  It was sent to the hospital.  Advised her to follow along with the app on her phone called MyChart.  She should go ahead and isolate until she has her test results.  She says she does not need a work note. 5.  Supportive care, over-the-counter meds as needed.  Tylenol or Motrin for any fever or discomfort.  Plenty of rest and plenty fluids. 6.  If symptoms persist she should see her PCP. 7.  If symptoms worsen she should go to the ER. 8.  She was discharged in stable condition will follow-up here as needed.    Final Clinical Impressions(s) / UC Diagnoses   Final diagnoses:  Viral upper respiratory tract infection  Cough  Nasal congestion  Sore throat (viral)     Discharge Instructions      As we discussed, you have a viral URI. I did prescribe 2 medications for your symptoms.  Use as directed, but as needed. Plenty of rest, plenty fluids, Tylenol or ibuprofen for any fever or discomfort. Please see educational handouts. If your symptoms persist and you can see your primary care provider. If your symptoms worsen then you should go to the ER or come back here.     ED Prescriptions     Medication Sig Dispense Auth. Provider   promethazine-dextromethorphan (PROMETHAZINE-DM) 6.25-15 MG/5ML syrup Take 5 mLs by mouth 4 (four) times daily as needed for cough. 180 mL Delton See, MD   fluticasone [facility]) 50 MCG/ACT nasal spray Place 2 sprays into both nostrils daily. 15.8 mL Delton See,  MD      PDMP not reviewed this encounter.   Delton See, MD 08/28/21 1013

## 2021-08-28 NOTE — Discharge Instructions (Addendum)
As we discussed, you have a viral URI. I did prescribe 2 medications for your symptoms.  Use as directed, but as needed. Plenty of rest, plenty fluids, Tylenol or ibuprofen for any fever or discomfort. Please see educational handouts. If your symptoms persist and you can see your primary care provider. If your symptoms worsen then you should go to the ER or come back here.
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
## 2021-08-29 LAB — SARS CORONAVIRUS 2 (TAT 6-24 HRS): SARS Coronavirus 2: POSITIVE — AB

## 2024-07-25 ENCOUNTER — Encounter (HOSPITAL_COMMUNITY): Payer: Self-pay | Admitting: Emergency Medicine

## 2024-07-25 ENCOUNTER — Ambulatory Visit (HOSPITAL_COMMUNITY): Admission: EM | Admit: 2024-07-25 | Discharge: 2024-07-25 | Disposition: A | Discharge status: Home / Self Care

## 2024-07-25 ENCOUNTER — Ambulatory Visit (INDEPENDENT_AMBULATORY_CARE_PROVIDER_SITE_OTHER)

## 2024-07-25 DIAGNOSIS — S161XXA Strain of muscle, fascia and tendon at neck level, initial encounter: Secondary | ICD-10-CM

## 2024-07-25 MED ORDER — BACLOFEN 10 MG PO TABS: 10.0000 mg | ORAL_TABLET | Freq: Three times a day (TID) | ORAL | 0 refills | Status: DC

## 2024-07-25 MED ORDER — PREDNISONE 20 MG PO TABS: 40.0000 mg | ORAL_TABLET | Freq: Every day | ORAL | 0 refills | Status: AC
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
## 2024-10-06 ENCOUNTER — Encounter (HOSPITAL_COMMUNITY): Payer: Self-pay

## 2024-10-06 ENCOUNTER — Ambulatory Visit (HOSPITAL_COMMUNITY)
Admission: EM | Admit: 2024-10-06 | Discharge: 2024-10-06 | Disposition: A | Discharge status: Home / Self Care | Attending: Student | Admitting: Student

## 2024-10-06 DIAGNOSIS — R399 Unspecified symptoms and signs involving the genitourinary system: Secondary | ICD-10-CM | POA: Diagnosis not present

## 2024-10-06 LAB — POCT URINALYSIS DIP (MANUAL ENTRY)
Bilirubin, UA: NEGATIVE
Glucose, UA: NEGATIVE mg/dL
Ketones, POC UA: NEGATIVE mg/dL
Nitrite, UA: NEGATIVE
Protein Ur, POC: NEGATIVE mg/dL
Spec Grav, UA: <=1.005 — AB (ref 1.010–1.025)
Urobilinogen, UA: 0.2 U/dL
pH, UA: 6 (ref 5.0–8.0)

## 2024-10-06 LAB — POCT URINE PREGNANCY: Preg Test, Ur: NEGATIVE

## 2024-10-06 MED ORDER — CEPHALEXIN 500 MG PO CAPS: 500.0000 mg | ORAL_CAPSULE | Freq: Two times a day (BID) | ORAL | 0 refills | Status: AC

## 2024-10-06 NOTE — Discharge Instructions (Signed)
-  Keflex twice daily x7 days -Drink plenty of fluids. Okay to continue cranberry pills -Follow-up if symptoms persist or worsen: abdominal pain, back pain, new fevers

## 2024-10-06 NOTE — ED Provider Notes (Signed)
 [facility]    CSN: [id number] Arrival date & time: 10/06/24  9166      History   Chief Complaint Chief Complaint  Patient presents with   Abdominal Pain    HPI Robin Hanna is a 28 y.o. female presenting with urinary symptoms for about 10 days. As a child had recurrent UTI and reflux bladder, but denies issues with this as an adult.  Describes suprapubic pressure and stabbing pains after urination, which terminates on its own after 2-3 minutes, worse after morning void. Also some pain with moving the abdominal muscles. Rates discomfort 3/10. Nausea without vomiting this morning. Taking cranberry pills and good hydration with some improvements. LMP was 2 weeks ago and was normal. Denies vaginal symptoms.   HPI  Past Medical History:  Diagnosis Date   Dysmenorrhea     There are no active problems to display for this patient.   History reviewed. No pertinent surgical history.  OB History   No obstetric history on file.      Home Medications    Prior to Admission medications   Medication Sig Start Date End Date Taking? Authorizing Provider  cephALEXin (KEFLEX) 500 MG capsule Take 1 capsule (500 mg total) by mouth 2 (two) times daily for 7 days. 10/06/24 10/13/24 Yes Arlyss Leita FORBES, PA-C    Family History History reviewed. No pertinent family history.  Social History Social History   Tobacco Use   Smoking status: Never   Smokeless tobacco: Never  Vaping Use   Vaping status: Never Used  Substance Use Topics   Alcohol use: No   Drug use: Not Currently     Allergies   Ibuprofen and Nsaids   Review of Systems Review of Systems  Constitutional:  Negative for appetite change, chills, diaphoresis and fever.  Respiratory:  Negative for shortness of breath.   Cardiovascular:  Negative for chest pain.  Gastrointestinal:  Positive for abdominal pain. Negative for blood in stool, constipation, diarrhea, nausea and vomiting.  Genitourinary:   Negative for decreased urine volume, difficulty urinating, dysuria, flank pain, frequency, genital sores, hematuria, urgency, vaginal discharge and vaginal pain.  Musculoskeletal:  Negative for back pain.  Neurological:  Negative for dizziness, weakness and light-headedness.  All other systems reviewed and are negative.    Physical Exam Triage Vital Signs ED Triage Vitals  Encounter Vitals Group     BP 10/06/24 0928 (!) 116/57     Girls Systolic BP Percentile --      Girls Diastolic BP Percentile --      Boys Systolic BP Percentile --      Boys Diastolic BP Percentile --      Pulse Rate 10/06/24 0928 93     Resp 10/06/24 0928 16     Temp 10/06/24 0928 97.7 F (36.5 C)     Temp Source 10/06/24 0928 Oral     SpO2 10/06/24 0928 99 %     Weight 10/06/24 0928 130 lb (59 kg)     Height 10/06/24 0928 5' 6 (1.676 m)     Head Circumference --      Peak Flow --      Pain Score 10/06/24 0926 4     Pain Loc --      Pain Education --      Exclude from Growth Chart --    No data found.  Updated Vital Signs BP (!) 116/57 (BP Location: Right Arm)   Pulse 93   Temp 97.7 F (36.5  C) (Oral)   Resp 16   Ht 5' 6 (1.676 m)   Wt 130 lb (59 kg)   LMP 09/22/2024 (Approximate)   SpO2 99%   BMI 20.98 kg/m   Visual Acuity Right Eye Distance:   Left Eye Distance:   Bilateral Distance:    Right Eye Near:   Left Eye Near:    Bilateral Near:     Physical Exam Vitals reviewed.  Constitutional:      General: She is not in acute distress.    Appearance: Normal appearance. She is not ill-appearing.  HENT:     Head: Normocephalic and atraumatic.     Mouth/Throat:     Mouth: Mucous membranes are moist.     Comments: Moist mucous membranes Eyes:     Extraocular Movements: Extraocular movements intact.     Pupils: Pupils are equal, round, and reactive to light.  Cardiovascular:     Rate and Rhythm: Normal rate and regular rhythm.     Heart sounds: Normal heart sounds.  Pulmonary:      Effort: Pulmonary effort is normal.     Breath sounds: Normal breath sounds. No wheezing, rhonchi or rales.  Abdominal:     General: Bowel sounds are normal. There is no distension.     Palpations: Abdomen is soft. There is no mass.     Tenderness: There is abdominal tenderness in the suprapubic area. There is no right CVA tenderness, left CVA tenderness, guarding or rebound. Negative signs include Murphy's sign, Rovsing's sign and McBurney's sign.     Comments: Suprapubic region is mildly tender to palpation  Skin:    General: Skin is warm.     Capillary Refill: Capillary refill takes less than 2 seconds.     Comments: Good skin turgor  Neurological:     General: No focal deficit present.     Mental Status: She is alert and oriented to person, place, and time.  Psychiatric:        Mood and Affect: Mood normal.        Behavior: Behavior normal.      UC Treatments / Results  Labs (all labs ordered are listed, but only abnormal results are displayed) Labs Reviewed  POCT URINALYSIS DIP (MANUAL ENTRY) - Abnormal; Notable for the following components:      Result Value   Clarity, UA cloudy (*)    Spec Grav, UA <=1.005 (*)    Blood, UA trace-intact (*)    Leukocytes, UA Small (1+) (*)    All other components within normal limits  POCT URINE PREGNANCY - Normal  URINE CULTURE    EKG   Radiology No results found.  Procedures Procedures (including critical care time)  Medications Ordered in UC Medications - No data to display  Initial Impression / Assessment and Plan / UC Course  I have reviewed the triage vital signs and the nursing notes.  Pertinent labs & imaging results that were available during my care of the patient were reviewed by me and considered in my medical decision making (see chart for details).     Patient is a 28 year old female presenting with urinary symptoms for about 10 days.  Primary symptom is lower abdominal discomfort following voids.  On UA,  small leuk, trace blood, negative nitrite.  Negative urine pregnancy test.  The patient denies vaginal symptoms.  We will manage for UTI with Keflex pending urine culture results.  We also assisted with finding her a PCP today.  Return precautions:  Worsening symptoms, including abdominal pain, new back pain, new fevers, new vaginal symptoms.  Final Clinical Impressions(s) / UC Diagnoses   Final diagnoses:  Urinary symptom or sign     Discharge Instructions      -Keflex twice daily x7 days -Drink plenty of fluids. Okay to continue cranberry pills -Follow-up if symptoms persist or worsen: abdominal pain, back pain, new fevers      ED Prescriptions     Medication Sig Dispense Auth. Provider   cephALEXin (KEFLEX) 500 MG capsule Take 1 capsule (500 mg total) by mouth 2 (two) times daily for 7 days. 14 capsule Sabastien Tyler E, PA-C      PDMP not reviewed this encounter.   Arlyss Leita BRAVO, PA-C 10/06/24 1039

## 2024-10-06 NOTE — ED Triage Notes (Signed)
 Onset 1 week ago with lower abdomen pressure after urination, sharp stabbing lower abdominal pain, abdominal pain with movement. Denies any painful urination.   Has been taking otc cranberry pills and increased fluid intake with slight relief.

## 2024-12-07 ENCOUNTER — Ambulatory Visit (INDEPENDENT_AMBULATORY_CARE_PROVIDER_SITE_OTHER): Payer: Self-pay | Admitting: Nurse Practitioner

## 2024-12-07 VITALS — BP 98/59 | HR 105 | Wt 133.4 lb

## 2024-12-07 DIAGNOSIS — Z1329 Encounter for screening for other suspected endocrine disorder: Secondary | ICD-10-CM | POA: Diagnosis not present

## 2024-12-07 DIAGNOSIS — K6289 Other specified diseases of anus and rectum: Secondary | ICD-10-CM | POA: Diagnosis not present

## 2024-12-07 DIAGNOSIS — R300 Dysuria: Secondary | ICD-10-CM | POA: Diagnosis not present

## 2024-12-07 DIAGNOSIS — Z13220 Encounter for screening for lipoid disorders: Secondary | ICD-10-CM

## 2024-12-07 DIAGNOSIS — Z0000 Encounter for general adult medical examination without abnormal findings: Secondary | ICD-10-CM

## 2024-12-07 DIAGNOSIS — N390 Urinary tract infection, site not specified: Secondary | ICD-10-CM | POA: Diagnosis not present

## 2024-12-07 DIAGNOSIS — K625 Hemorrhage of anus and rectum: Secondary | ICD-10-CM

## 2024-12-07 LAB — POCT URINALYSIS DIP (CLINITEK)
Bilirubin, UA: NEGATIVE
Glucose, UA: NEGATIVE mg/dL
Ketones, POC UA: NEGATIVE mg/dL
Nitrite, UA: NEGATIVE
POC PROTEIN,UA: NEGATIVE
Spec Grav, UA: 1.025 (ref 1.010–1.025)
Urobilinogen, UA: 0.2 U/dL
pH, UA: 6.5 (ref 5.0–8.0)

## 2024-12-07 MED ORDER — SULFAMETHOXAZOLE-TRIMETHOPRIM 800-160 MG PO TABS: 1.0000 | ORAL_TABLET | Freq: Two times a day (BID) | ORAL | 0 refills | Status: AC

## 2024-12-07 NOTE — Progress Notes (Signed)
 Subjective   Patient ID: Robin Hanna, female    DOB: Aug 19, 1996, 28 y.o.   MRN: [id number]  Chief Complaint  Patient presents with   Establish Care   Cystitis    Patient stated that she had bladder pain after urinating. Lower abdominal pain and pressure.     Referring provider: [place], [place] [facility]  Robin Hanna is a 28 y.o. female with Past Medical History: No date: Dysmenorrhea   HPI  Patient presents today to establish care.  She would like a physical with blood work today.  She does complain today of dysuria and lower suprapubic abdominal pain.  She has been taking cranberry supplements and drinking water.  She does also complain of occasional rectal pain with rectal bleeding.  She states that it feels like severe rectal cramps.  We will place a referral for her to GI for further evaluation. Denies f/c/s, n/v/d, hemoptysis, PND, leg swelling Denies chest pain or edema          Allergies  Allergen Reactions   Ibuprofen Swelling   Nsaids Swelling   Azithromycin  Palpitations     There is no immunization history on file for this patient.  Tobacco History: Social History   Tobacco Use  Smoking Status Never  Smokeless Tobacco Never   Counseling given: Not Answered   Outpatient Encounter Medications as of 12/07/2024  Medication Sig   sulfamethoxazole-trimethoprim (BACTRIM DS) 800-160 MG tablet Take 1 tablet by mouth 2 (two) times daily for 3 days.   No facility-administered encounter medications on file as of 12/07/2024.    Review of Systems  Review of Systems  Constitutional: Negative.   HENT: Negative.    Cardiovascular: Negative.   Gastrointestinal: Negative.   Allergic/Immunologic: Negative.   Neurological: Negative.   Psychiatric/Behavioral: Negative.       Objective:   BP (!) 98/59 (BP Location: Right Arm, Patient Position: Sitting, Cuff Size: Normal)   Pulse (!) 105   Wt 133 lb 6.4 oz (60.5 kg)   SpO2 98%   BMI 21.53 kg/m    Wt Readings from Last 5 Encounters:  12/07/24 133 lb 6.4 oz (60.5 kg)  10/06/24 130 lb (59 kg)  08/28/21 115 lb (52.2 kg)  08/21/21 115 lb (52.2 kg)  09/30/19 115 lb (52.2 kg)     Physical Exam Vitals and nursing note reviewed.  Constitutional:      General: She is not in acute distress.    Appearance: She is well-developed.  Cardiovascular:     Rate and Rhythm: Normal rate and regular rhythm.  Pulmonary:     Effort: Pulmonary effort is normal.     Breath sounds: Normal breath sounds.  Neurological:     Mental Status: She is alert and oriented to person, place, and time.       Assessment & Plan:   Dysuria -     POCT URINALYSIS DIP (CLINITEK)  Rectal pain -     Ambulatory referral to Gastroenterology  Rectal bleeding -     Ambulatory referral to Gastroenterology -     CBC -     Comprehensive metabolic panel with GFR  Lipid screening -     Lipid panel  Thyroid  disorder screen -     TSH  Routine adult health maintenance -     CBC -     Comprehensive metabolic panel with GFR -     Lipid panel -     TSH  Urinary tract infection without hematuria, site  unspecified -     Urine Culture  Other orders -     Sulfamethoxazole-Trimethoprim; Take 1 tablet by mouth 2 (two) times daily for 3 days.  Dispense: 6 tablet; Refill: 0     Return in about 4 weeks (around 01/04/2025) for uti follow up.     Bascom GORMAN Borer, NP 12/07/2024

## 2024-12-08 ENCOUNTER — Ambulatory Visit: Admitting: Nurse Practitioner

## 2024-12-08 ENCOUNTER — Ambulatory Visit: Payer: Self-pay | Admitting: Nurse Practitioner

## 2024-12-08 LAB — COMPREHENSIVE METABOLIC PANEL WITH GFR
ALT: 10 IU/L (ref 0–32)
AST: 15 IU/L (ref 0–40)
Albumin: 4.6 g/dL (ref 4.0–5.0)
Alkaline Phosphatase: 47 IU/L (ref 41–116)
BUN/Creatinine Ratio: 21 (ref 9–23)
BUN: 17 mg/dL (ref 6–20)
Bilirubin Total: 0.2 mg/dL (ref 0.0–1.2)
CO2: 19 mmol/L — ABNORMAL LOW (ref 20–29)
Calcium: 9.3 mg/dL (ref 8.7–10.2)
Chloride: 102 mmol/L (ref 96–106)
Creatinine, Ser: 0.8 mg/dL (ref 0.57–1.00)
Globulin, Total: 2.3 g/dL (ref 1.5–4.5)
Glucose: 82 mg/dL (ref 70–99)
Potassium: 4.5 mmol/L (ref 3.5–5.2)
Sodium: 136 mmol/L (ref 134–144)
Total Protein: 6.9 g/dL (ref 6.0–8.5)
eGFR: 103 mL/min/1.73 (ref >59)

## 2024-12-08 LAB — CBC
Hematocrit: 38.4 % (ref 34.0–46.6)
Hemoglobin: 12.6 g/dL (ref 11.1–15.9)
MCH: 30.4 pg (ref 26.6–33.0)
MCHC: 32.8 g/dL (ref 31.5–35.7)
MCV: 93 fL (ref 79–97)
Platelets: 265 x10E3/uL (ref 150–450)
RBC: 4.15 x10E6/uL (ref 3.77–5.28)
RDW: 12.8 % (ref 11.7–15.4)
WBC: 7.6 x10E3/uL (ref 3.4–10.8)

## 2024-12-08 LAB — LIPID PANEL
Chol/HDL Ratio: 2.5 ratio (ref 0.0–4.4)
Cholesterol, Total: 187 mg/dL (ref 100–199)
HDL: 76 mg/dL (ref >39)
LDL Chol Calc (NIH): 99 mg/dL (ref 0–99)
Triglycerides: 61 mg/dL (ref 0–149)
VLDL Cholesterol Cal: 12 mg/dL (ref 5–40)

## 2024-12-08 LAB — TSH: TSH: 2.16 u[IU]/mL (ref 0.450–4.500)

## 2024-12-09 LAB — URINE CULTURE: Organism ID, Bacteria: NO GROWTH

## 2025-01-05 ENCOUNTER — Encounter: Payer: Self-pay | Admitting: Nurse Practitioner

## 2025-01-05 ENCOUNTER — Ambulatory Visit: Payer: Self-pay | Admitting: Nurse Practitioner

## 2025-01-05 VITALS — BP 100/60 | HR 85 | Temp 98.1°F | Wt 144.0 lb

## 2025-01-05 DIAGNOSIS — R300 Dysuria: Secondary | ICD-10-CM

## 2025-01-05 LAB — POCT URINALYSIS DIP (CLINITEK)
Bilirubin, UA: NEGATIVE
Glucose, UA: NEGATIVE mg/dL
Ketones, POC UA: NEGATIVE mg/dL
Nitrite, UA: NEGATIVE
POC PROTEIN,UA: NEGATIVE
Spec Grav, UA: 1.02
Urobilinogen, UA: 0.2 U/dL
pH, UA: 7

## 2025-01-05 NOTE — Progress Notes (Unsigned)
" ° °  Subjective   Patient ID: Robin Hanna, female    DOB: Nov 06, 1996, 29 y.o.   MRN: [id number]  Chief Complaint  Patient presents with   Dysuria    4 week follow-up. Still having same issues. Getting pressure and sinking pain in abdominal area after emptying bladder. More fatigue yesterday with back discomfort.     Referring provider: [place] [place] [facility]  Robin Hanna is a 29 y.o. female with Past Medical History: No date: Dysmenorrhea   HPI  Patient presents for follow-up visit today.  Still complaining of suprapubic pain after voiding.  UA in office today did show trace leukocytes.  We will place a referral for patient to urology to follow-up for further evaluation and treatment. Denies f/c/s, n/v/d, hemoptysis, PND, leg swelling Denies chest pain or edema     Allergies[1]   There is no immunization history on file for this patient.  Tobacco History: Tobacco Use History[2] Counseling given: Not Answered   No outpatient encounter medications on file as of 01/05/2025.   No facility-administered encounter medications on file as of 01/05/2025.    Review of Systems  Review of Systems  Constitutional: Negative.   HENT: Negative.    Cardiovascular: Negative.   Gastrointestinal: Negative.   Genitourinary:  Positive for dysuria and frequency.  Allergic/Immunologic: Negative.   Neurological: Negative.   Psychiatric/Behavioral: Negative.       Objective:   BP 100/60   Pulse 85   Temp 98.1 F (36.7 C) (Temporal)   Wt 144 lb (65.3 kg)   SpO2 98%   BMI 23.24 kg/m   Wt Readings from Last 5 Encounters:  01/05/25 144 lb (65.3 kg)  12/07/24 133 lb 6.4 oz (60.5 kg)  10/06/24 130 lb (59 kg)  08/28/21 115 lb (52.2 kg)  08/21/21 115 lb (52.2 kg)     Physical Exam Vitals and nursing note reviewed.  Constitutional:      General: Robin Hanna is not in acute distress.    Appearance: Robin Hanna is well-developed.  Cardiovascular:     Rate and Rhythm: Normal rate and  regular rhythm.  Pulmonary:     Effort: Pulmonary effort is normal.     Breath sounds: Normal breath sounds.  Neurological:     Mental Status: Robin Hanna is alert and oriented to person, place, and time.       Assessment & Plan:   Dysuria -     Ambulatory referral to Urology -     POCT URINALYSIS DIP (CLINITEK)     Return if symptoms worsen or fail to improve.   Bascom GORMAN Borer, NP 01/11/2025     [1]  Allergies Allergen Reactions   Ibuprofen Swelling   Nsaids Swelling   Azithromycin  Palpitations  [2]  Social History Tobacco Use  Smoking Status Never  Smokeless Tobacco Never   "

## 2025-01-05 NOTE — Patient Instructions (Signed)
 [facility]:  please call [phone] to make an appointment.

## 2025-01-11 ENCOUNTER — Encounter: Payer: Self-pay | Admitting: Nurse Practitioner

## 2025-02-01 NOTE — Progress Notes (Unsigned)
 "   02/02/2025 Robin Hanna [id number] 1996/10/28  Gastroenterology Office Note    Referring Provider: Oley Bascom RAMAN, NP Primary Care Physician:  Oley Bascom RAMAN, NP  Primary GI Provider: Celestia Rima, NP; Theophilus Aloysius Fines, MD    Chief Complaint   Chief Complaint  Patient presents with   New Patient (Initial Visit)    Rectal pain during BM-normal BM's blood when wiping a few times-     History of Present Illness   Robin Hanna is a 29 y.o. female presenting today at the request of Oley Bascom RAMAN, NP due to rectal pain and bleeding.   Discussed the use of AI scribe software for clinical note transcription with the patient, who gave verbal consent to proceed.  Rectal cramping pain has been present for several years, initially occurring primarily during menstruation and resolving after her period. Over time, pain frequency increased and now occurs with nearly every bowel movement. Pain is described as cramping, sometimes excruciating, and is localized internally. It is triggered by passage of stool, regardless of consistency, and does not occur outside of bowel movements. No pain is reported while sitting, driving, or at rest.  Intermittent rectal bleeding is associated with episodes of constipation and is noticed only when wiping. No rectal itching or pain with wiping. No history of black, tarry, or sticky stools.  Bowel movements occur approximately once daily. During constipation, intervals may extend to two to three days, though this is less severe than in childhood. Stool consistency is usually formed, sometimes with a ball-like texture, ranging from type 2 to type 3 on the [facility] Stool Chart, with occasional type 6. No current use of fiber supplements or laxatives. Fluid intake is estimated at three cups (24 ounces) of water daily, in addition to tea and other non-soda, non-alcoholic beverages. NSAIDs are avoided due to allergy.  Severe menstrual cramps are  present. Bladder spasms have occurred for the past four months, for which she saw a urologist two weeks ago. She is unsure if these symptoms are related to her rectal pain. No abnormal or unexplained weight loss; recent weight gain noted after marriage. No prior Pap smear due to significant pelvic and rectal sensitivity.   Patient seen by PCP on 12/07/2024 with rectal pain. States it feels like severe rectal cramps.   Past Medical History:  Diagnosis Date   Abnormal EKG 02/24/2020   Chronic abdominal pain 02/24/2020   Dysmenorrhea    Palpitations 02/20/2020    Past Surgical History:  Procedure Laterality Date   TONSILLECTOMY AND ADENOIDECTOMY     tubes in ears      No current outpatient medications on file.   No current facility-administered medications for this visit.    Allergies as of 02/02/2025 - Review Complete 02/02/2025  Allergen Reaction Noted   Ibuprofen Swelling 05/18/2015   Nsaids Swelling 08/28/2021   Azithromycin  Palpitations 12/07/2024    Family History  Problem Relation Age of Onset   Migraines Mother    Patau's syndrome Mother    Hypermobility Mother    Deep vein thrombosis Mother    Thyroid  disease Mother    Appendicitis Father    Hypertension Maternal Grandmother    High Cholesterol Maternal Grandmother    Deep vein thrombosis Maternal Grandmother    Colon cancer Maternal Grandfather    Prostate cancer Maternal Grandfather    Osteoporosis Paternal Grandmother    High blood pressure Paternal Grandmother    High Cholesterol Paternal Grandmother    COPD  Paternal Grandfather    Leukemia Paternal Grandfather     Social History   Socioeconomic History   Marital status: Married    Spouse name: Adine Ave   Number of children: 0   Years of education: bachelotrs   Highest education level: Bachelor's degree (e.g., BA, AB, BS)  Occupational History   Not on file  Tobacco Use   Smoking status: Never   Smokeless tobacco: Never  Vaping Use    Vaping status: Never Used  Substance and Sexual Activity   Alcohol use: No    Comment: 3-4x per moonth   Drug use: Not Currently   Sexual activity: Yes  Other Topics Concern   Not on file  Social History Narrative   Not on file   Social Drivers of Health   Tobacco Use: Low Risk (02/02/2025)   Patient History    Smoking Tobacco Use: Never    Smokeless Tobacco Use: Never    Passive Exposure: Not on file  Financial Resource Strain: Low Risk (12/07/2024)   Overall Financial Resource Strain (CARDIA)    Difficulty of Paying Living Expenses: Not very hard  Food Insecurity: No Food Insecurity (01/05/2025)   Epic    Worried About Radiation Protection Practitioner of Food in the Last Year: Never true    Ran Out of Food in the Last Year: Never true  Transportation Needs: No Transportation Needs (01/05/2025)   Epic    Lack of Transportation (Medical): No    Lack of Transportation (Non-Medical): No  Physical Activity: Insufficiently Active (12/07/2024)   Exercise Vital Sign    Days of Exercise per Week: 1 day    Minutes of Exercise per Session: 20 min  Stress: No Stress Concern Present (12/07/2024)   [employer] of Occupational Health - Occupational Stress Questionnaire    Feeling of Stress: Not at all  Social Connections: Socially Integrated (12/07/2024)   Social Connection and Isolation Panel    Frequency of Communication with Friends and Family: More than three times a week    Frequency of Social Gatherings with Friends and Family: Three times a week    Attends Religious Services: More than 4 times per year    Active Member of Clubs or Organizations: Yes    Attends Banker Meetings: More than 4 times per year    Marital Status: Married  Catering Manager Violence: Not At Risk (01/05/2025)   Epic    Fear of Current or Ex-Partner: No    Emotionally Abused: No    Physically Abused: No    Sexually Abused: No  Depression (PHQ2-9): Low Risk (01/05/2025)   Depression (PHQ2-9)    PHQ-2 Score: 0   Alcohol Screen: Low Risk (12/07/2024)   Alcohol Screen    Last Alcohol Screening Score (AUDIT): 2  Housing: Low Risk (01/05/2025)   Epic    Unable to Pay for Housing in the Last Year: No    Number of Times Moved in the Last Year: 0    Homeless in the Last Year: No  Utilities: Not At Risk (01/05/2025)   Epic    Threatened with loss of utilities: No  Health Literacy: Not on file     RELEVANT GI HISTORY, IMAGING AND LABS: CBC    Component Value Date/Time   WBC 7.6 12/07/2024 1427   WBC 9.9 09/30/2019 1320   RBC 4.15 12/07/2024 1427   RBC 4.29 09/30/2019 1320   HGB 12.6 12/07/2024 1427   HCT 38.4 12/07/2024 1427   PLT 265 12/07/2024  1427   MCV 93 12/07/2024 1427   MCH 30.4 12/07/2024 1427   MCH 30.3 09/30/2019 1320   MCHC 32.8 12/07/2024 1427   MCHC 33.6 09/30/2019 1320   RDW 12.8 12/07/2024 1427   Recent Labs    12/07/24 1427  HGB 12.6    CMP     Component Value Date/Time   NA 136 12/07/2024 1427   K 4.5 12/07/2024 1427   CL 102 12/07/2024 1427   CO2 19 (L) 12/07/2024 1427   GLUCOSE 82 12/07/2024 1427   GLUCOSE 95 09/30/2019 1320   BUN 17 12/07/2024 1427   CREATININE 0.80 12/07/2024 1427   CALCIUM 9.3 12/07/2024 1427   PROT 6.9 12/07/2024 1427   ALBUMIN 4.6 12/07/2024 1427   AST 15 12/07/2024 1427   ALT 10 12/07/2024 1427   ALKPHOS 47 12/07/2024 1427   BILITOT 0.2 12/07/2024 1427   GFRNONAA >60 09/30/2019 1320   GFRAA >60 09/30/2019 1320      Latest Ref Rng & Units 12/07/2024    2:27 PM  Hepatic Function  Total Protein 6.0 - 8.5 g/dL 6.9   Albumin 4.0 - 5.0 g/dL 4.6   AST 0 - 40 IU/L 15   ALT 0 - 32 IU/L 10   Alk Phosphatase 41 - 116 IU/L 47   Total Bilirubin 0.0 - 1.2 mg/dL 0.2       Review of Systems   All systems reviewed and negative except where noted in HPI.    Physical Exam  BP 110/75   Pulse 87   Temp 98.2 F (36.8 C)   Ht 5' 6 (1.676 m)   Wt 132 lb 12.8 oz (60.2 kg)   LMP 01/04/2025   SpO2 100%   BMI 21.43 kg/m  Patient's  last menstrual period was 01/04/2025. General:   Alert and oriented. Pleasant and cooperative. Well-nourished and well-developed. In no acute distress.  Head:  Normocephalic and atraumatic. Eyes:  Without icterus Ears:  Normal auditory acuity. Lungs:  Respirations even and unlabored.  Clear throughout to auscultation.   No wheezes, crackles, or rhonchi. No acute distress. Heart:  Regular rate and rhythm; no murmurs, clicks, rubs, or gallops. Abdomen:  Normal bowel sounds.  No bruits.  Soft, non-tender and non-distended without masses, hepatosplenomegaly or hernias noted.  No guarding or rebound tenderness. Rectal: Dorothe, CMA chaperone. DRE without obvious mass, no discomfort, no external hemorrhoids.  Msk:  Symmetrical without gross deformities. Normal posture. Extremities:  Without edema. Neurologic:  Alert and  oriented x4;  grossly normal neurologically. Skin:  Intact without significant lesions or rashes. Psych:  Alert and cooperative. Normal mood and affect.   Assessment & Plan   Robin Hanna is a 29 y.o. female presenting today with:   Chronic rectal pain and bleeding. Discussed possible internal hemorrhoids, anal fissure, proctalgia fugax  - Proceed with colonoscopy in near future to further evaluate. The risks, benefits, and alternatives have been discussed with the patient in detail, which include, but are not limited to: bleeding, infection, perforation & drug reaction. The patient states understanding and desires to proceed.  Chronic constipation. Has improved, occasional hard stools.  - Recommended initiation of fiber supplement to bulk stool. - Recommended initiation of MiraLAX to promote water retention in stool and ease passage. - Advised to increase water intake to at least 64 ounces daily, especially with increased fiber intake.  Follow up next visit with Dr. Melany  Grayce Bohr, DNP, AGNP-C [facility]  "

## 2025-02-02 ENCOUNTER — Encounter: Payer: Self-pay | Admitting: Family Medicine

## 2025-02-02 ENCOUNTER — Ambulatory Visit: Admitting: Family Medicine

## 2025-02-02 VITALS — BP 110/75 | HR 87 | Temp 98.2°F | Ht 66.0 in | Wt 132.8 lb

## 2025-02-02 DIAGNOSIS — K6289 Other specified diseases of anus and rectum: Secondary | ICD-10-CM

## 2025-02-02 DIAGNOSIS — K5909 Other constipation: Secondary | ICD-10-CM

## 2025-02-02 DIAGNOSIS — K625 Hemorrhage of anus and rectum: Secondary | ICD-10-CM

## 2025-02-02 MED ORDER — NA SULFATE-K SULFATE-MG SULF 17.5-3.13-1.6 GM/177ML PO SOLN: 1.0000 | Freq: Once | ORAL | 0 refills | Status: AC

## 2025-02-15 ENCOUNTER — Ambulatory Visit: Admit: 2025-02-15

## 2025-02-23 ENCOUNTER — Ambulatory Visit: Admitting: Gastroenterology
